# Patient Record
Sex: Male | Born: 1995 | Race: White | Hispanic: No | Marital: Married | Smoking: Never smoker
Health system: Southern US, Community
[De-identification: ages and names within clinical notes are randomized; demographics above are authoritative.]

## PROBLEM LIST (undated history)

## (undated) HISTORY — PX: OTHER SURGICAL HISTORY: SHX169

## (undated) HISTORY — PX: TONSILLECTOMY: SUR1361

---
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
## 2017-05-30 ENCOUNTER — Emergency Department (HOSPITAL_BASED_OUTPATIENT_CLINIC_OR_DEPARTMENT_OTHER)
Admission: EM | Admit: 2017-05-30 | Discharge: 2017-05-30 | Disposition: A | Discharge status: Home / Self Care | Payer: 59 | Attending: Emergency Medicine | Admitting: Emergency Medicine

## 2017-05-30 ENCOUNTER — Emergency Department (HOSPITAL_BASED_OUTPATIENT_CLINIC_OR_DEPARTMENT_OTHER): Payer: 59

## 2017-05-30 ENCOUNTER — Encounter (HOSPITAL_BASED_OUTPATIENT_CLINIC_OR_DEPARTMENT_OTHER): Payer: Self-pay | Admitting: *Deleted

## 2017-05-30 ENCOUNTER — Other Ambulatory Visit: Payer: Self-pay

## 2017-05-30 DIAGNOSIS — M545 Low back pain, unspecified: Secondary | ICD-10-CM

## 2017-05-30 MED ORDER — HYDROCODONE-ACETAMINOPHEN 5-325 MG PO TABS
1.0000 | ORAL_TABLET | Freq: Once | ORAL | Status: AC
  Administered 2017-05-30: 1 via ORAL
  Filled 2017-05-30: qty 1

## 2017-05-30 MED ORDER — CYCLOBENZAPRINE HCL 10 MG PO TABS: 10.0000 mg | ORAL_TABLET | Freq: Every evening | ORAL | 0 refills | Status: DC | PRN

## 2017-05-30 NOTE — Discharge Instructions (Signed)
Please read instructions below. Apply ice to your back for 20 minutes at a time. You can take advil/ibuprofen every 6 hours as needed for pain. OR you can take 500mg  of aleve every 12 hours. You can take flexeril at bedtime as needed for muscle spasm. Schedule an appointment with your primary care if symptoms persist Return to ER if new numbness or tingling in your arms or legs, inability to urinate, inability to hold your bowels, or weakness in your extremities.

## 2017-05-30 NOTE — ED Triage Notes (Signed)
Back injury today. He was at work and bent down. He felt a pop in his lower back.

## 2017-05-30 NOTE — ED Provider Notes (Signed)
[facility] EMERGENCY DEPARTMENT Provider Note   CSN: [id number] Arrival date & time: 05/30/17  1957     History   Chief Complaint Chief Complaint  Patient presents with  . Back Pain    HPI Cameron Carlson is a 22 y.o. male presenting to the ED with acute onset of left-sided lower back pain that began this morning while at work.  Patient states he squatted down with a straight back, and felt a pop and had pain.  He states he was not lifting anything or bending forward at the time.  He states he has been noticing pain is been worsening throughout the day, improved mildly with ibuprofen and Aleve.  Pain radiates somewhat into his left buttock.  Denies numbness or tingling in extremities, saddle paresthesia, bowel bladder incontinence, or other complaints.  The history is provided by the patient.    History reviewed. No pertinent past medical history.  There are no active problems to display for this patient.   Past Surgical History:  Procedure Laterality Date  . TONSILLECTOMY         Home Medications    Prior to Admission medications   Medication Sig Start Date End Date Taking? Authorizing Provider  cyclobenzaprine (FLEXERIL) 10 MG tablet Take 1 tablet (10 mg total) by mouth at bedtime as needed for muscle spasms. 05/30/17   Robinson, [place] N, PA-C    Family History No family history on file.  Social History Social History   Tobacco Use  . Smoking status: Never Smoker  . Smokeless tobacco: Never Used  Substance Use Topics  . Alcohol use: No  . Drug use: No     Allergies   Patient has no known allergies.   Review of Systems Review of Systems  Gastrointestinal:       No bowel incontinence  Genitourinary: Negative for difficulty urinating.  Musculoskeletal: Positive for back pain.  Neurological: Negative for weakness and numbness.  All other systems reviewed and are negative.    Physical Exam Updated Vital Signs BP (!) 142/81   Pulse 85    Temp 98.5 F (36.9 C) (Oral)   Resp 18   Ht 6\' 3"  (1.905 m)   Wt 106.6 kg (235 lb)   SpO2 100%   BMI 29.37 kg/m   Physical Exam  Constitutional: He appears well-developed and well-nourished. No distress.  HENT:  Head: Normocephalic and atraumatic.  Eyes: Conjunctivae are normal.  Cardiovascular: Normal rate and intact distal pulses.  Pulmonary/Chest: Effort normal.  Abdominal: Soft. Bowel sounds are normal. He exhibits no distension. There is no tenderness.  Musculoskeletal:       Back:  No midline spinal or paraspinal tenderness, no bony step-offs or gross deformities.  Tenderness to left-sided lower back musculature.  Neurological:  Motor:  Normal tone. 5/5 inlower extremities bilaterally including strong and equal dorsiflexion/plantar flexion Sensory: Pinprick and light touch normal in all extremities.  Deep Tendon Reflexes: 2+ and symmetric in the biceps and patella Gait: normal gait and balance CV: distal pulses palpable throughout    Psychiatric: He has a normal mood and affect. His behavior is normal.  Nursing note and vitals reviewed.    ED Treatments / Results  Labs (all labs ordered are listed, but only abnormal results are displayed) Labs Reviewed - No data to display  EKG  EKG Interpretation None       Radiology Dg Lumbar Spine Complete  Result Date: 05/30/2017 CLINICAL DATA:  Back injury bending over.  Low back  pain EXAM: LUMBAR SPINE - COMPLETE 4+ VIEW COMPARISON:  None. FINDINGS: There is no evidence of lumbar spine fracture. Alignment is normal. Intervertebral disc spaces are maintained. IMPRESSION: Negative. Electronically Signed   By: Charlett Nose M.D.   On: 05/30/2017 22:39    Procedures Procedures (including critical care time)  Medications Ordered in ED Medications  HYDROcodone-acetaminophen (NORCO/VICODIN) 5-325 MG per tablet 1 tablet (1 tablet Oral Given 05/30/17 2248)     Initial Impression / Assessment and Plan / ED Course  I  have reviewed the triage vital signs and the nursing notes.  Pertinent labs & imaging results that were available during my care of the patient were reviewed by me and considered in my medical decision making (see chart for details).     Patient with acute back pain.  No neurological deficits and normal neuro exam.  Patient can walk but states is painful.  No loss of bowel or bladder control.  No concern for cauda equina.  L-spine xray is normal.  RICE protocol and pain medicine indicated and discussed with patient.   Discussed results, findings, treatment and follow up. Patient advised of return precautions. Patient verbalized understanding and agreed with plan.  Final Clinical Impressions(s) / ED Diagnoses   Final diagnoses:  Acute left-sided low back pain without sciatica    ED Discharge Orders        Ordered    cyclobenzaprine (FLEXERIL) 10 MG tablet  At bedtime PRN     05/30/17 2306       Robinson, [place] N, PA-C 05/30/17 2306    Pricilla Loveless, MD 05/31/17 [phone]

## 2018-08-30 IMAGING — DX DG LUMBAR SPINE COMPLETE 4+V
5 series · 5 of 5 positions shown · non-contrast
Comparison: None.

CLINICAL DATA: Back injury bending over.  Low back pain

EXAM:
LUMBAR SPINE - COMPLETE 4+ VIEW

[l-spine ap]
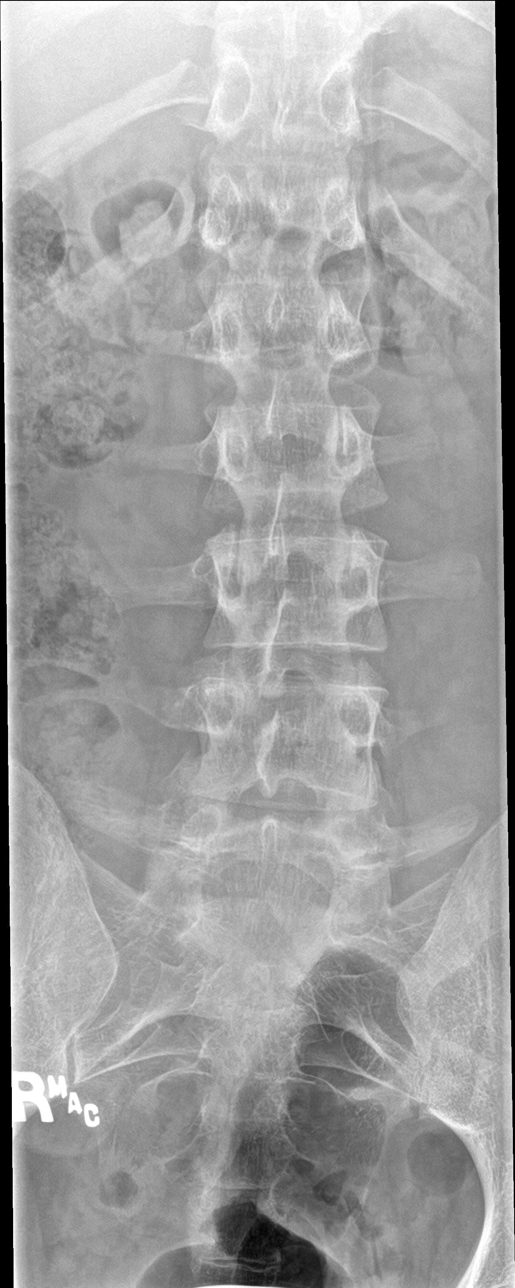

[l-spine obl (1 of 2)]
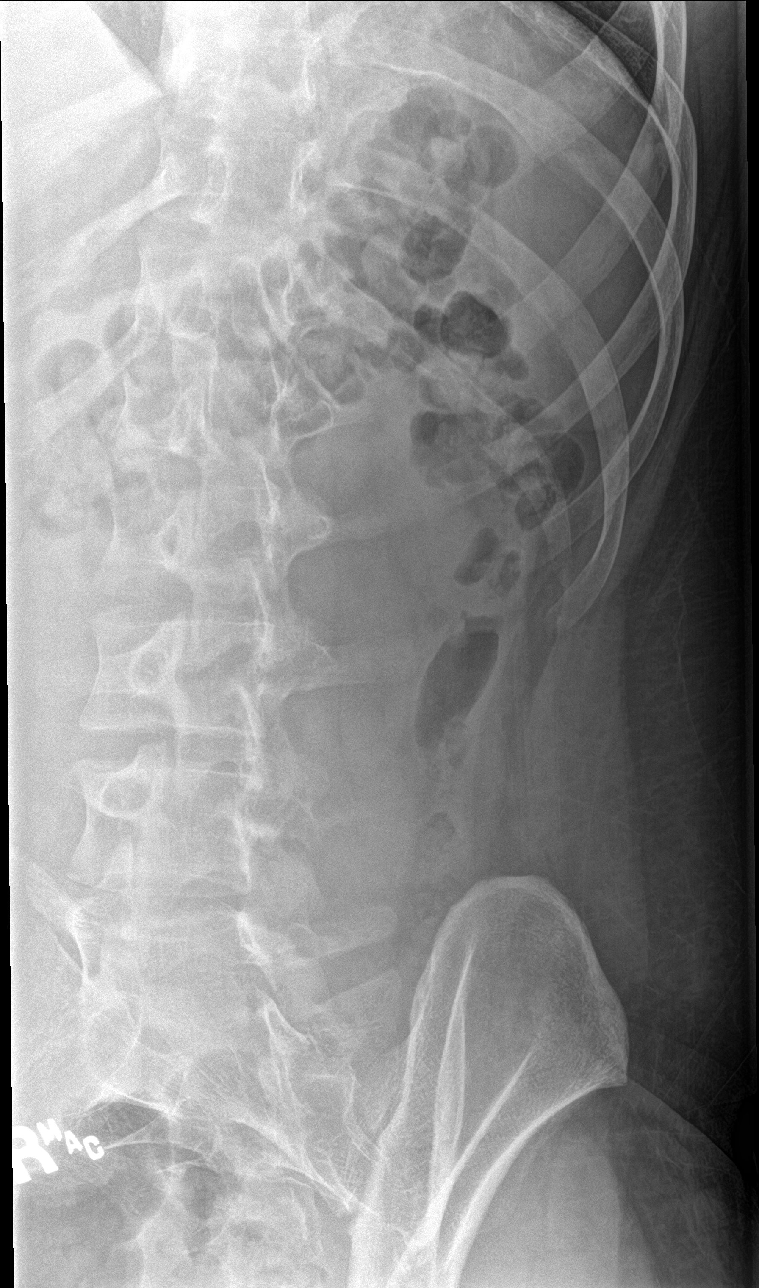

[l-spine obl (2 of 2)]
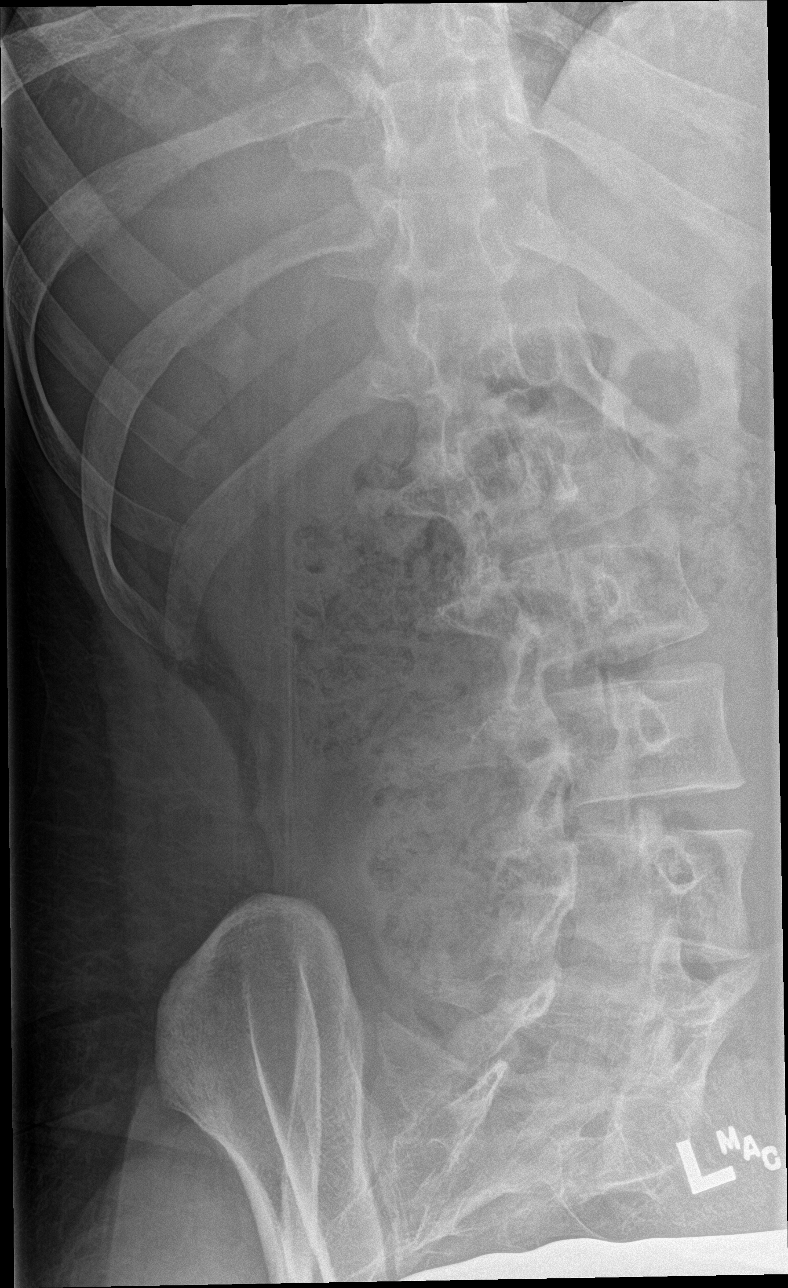

[l-spine lat]
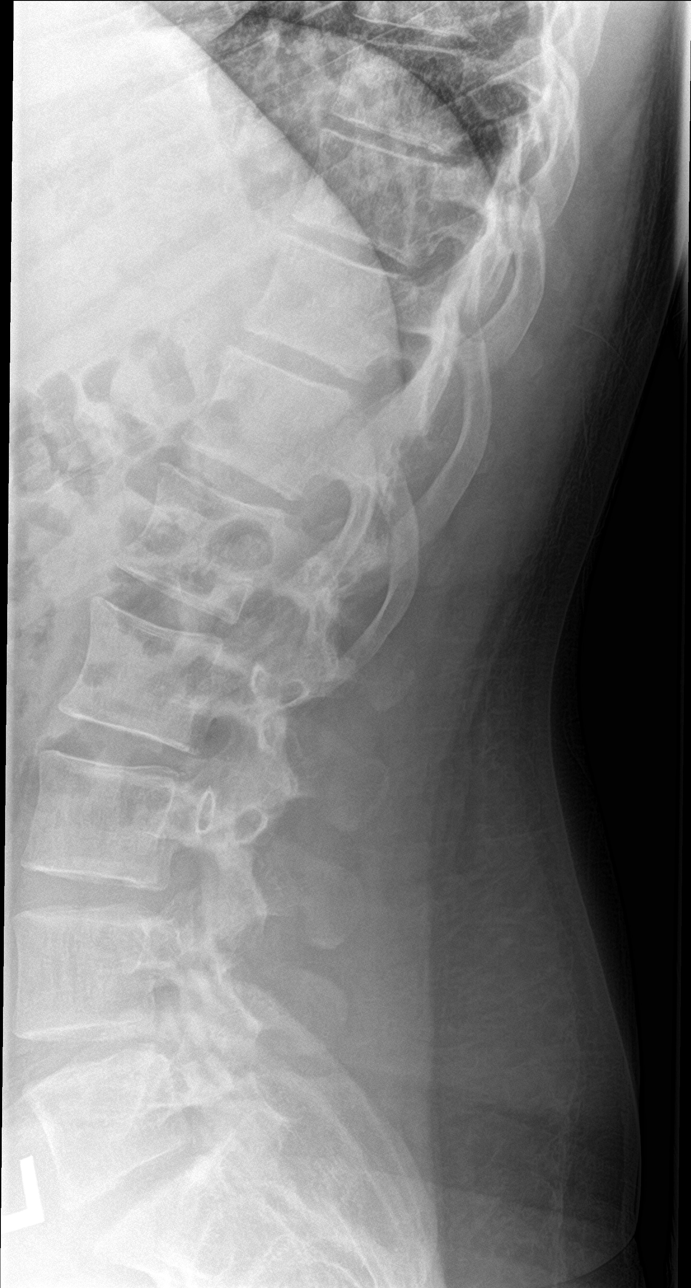

[l-spine spot]
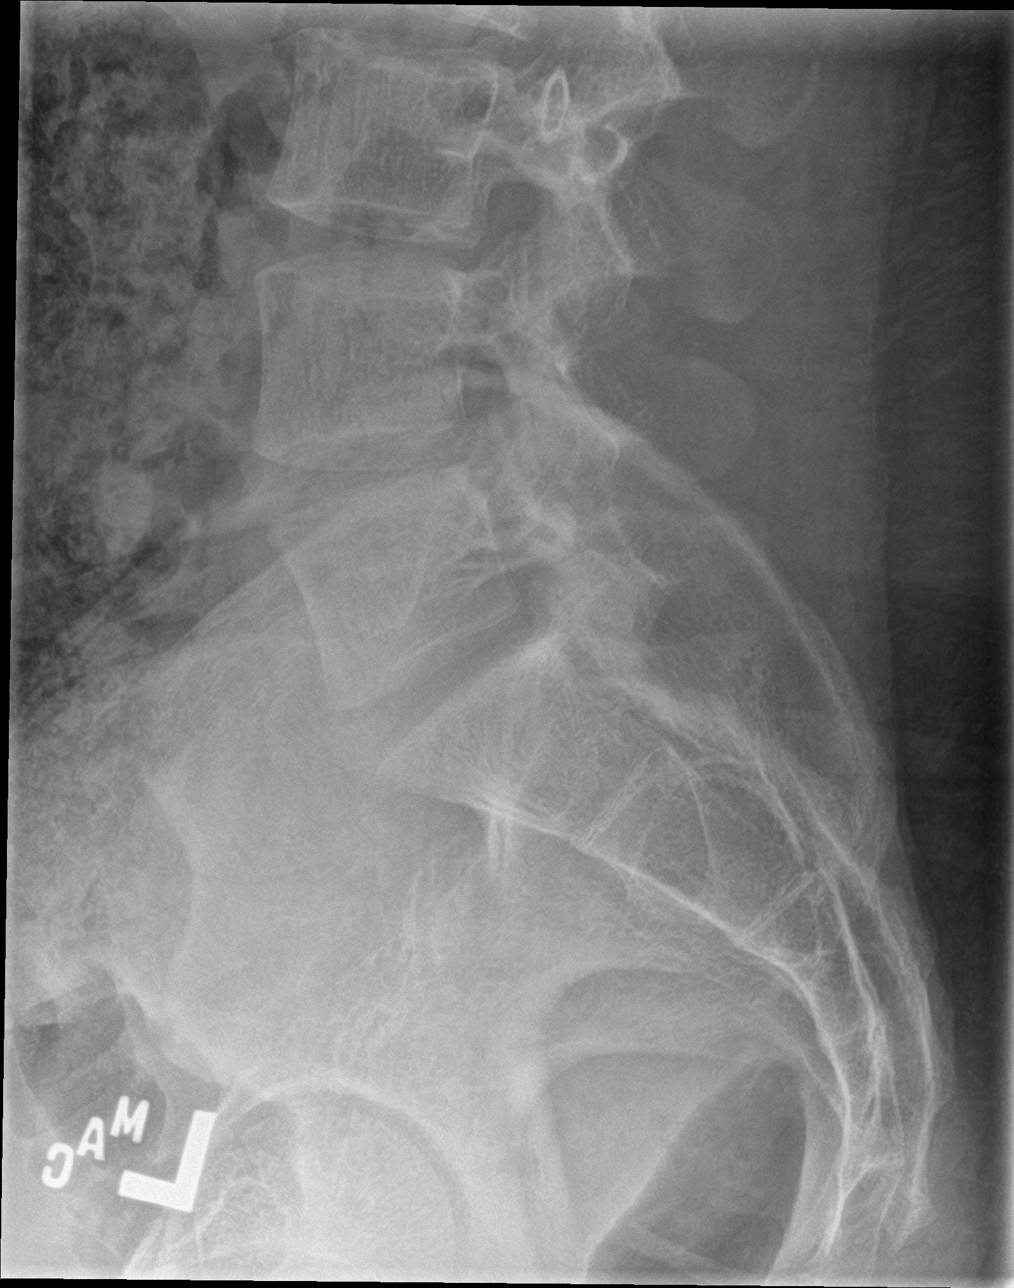

[5 of 5 positions shown; findings below may reference images not displayed]

FINDINGS: There is no evidence of lumbar spine fracture. Alignment is normal.
Intervertebral disc spaces are maintained.
IMPRESSION: Negative.

## 2019-02-12 ENCOUNTER — Encounter (HOSPITAL_BASED_OUTPATIENT_CLINIC_OR_DEPARTMENT_OTHER): Payer: Self-pay | Admitting: *Deleted

## 2019-02-12 ENCOUNTER — Other Ambulatory Visit: Payer: Self-pay

## 2019-02-12 ENCOUNTER — Emergency Department (HOSPITAL_BASED_OUTPATIENT_CLINIC_OR_DEPARTMENT_OTHER)
Admission: EM | Admit: 2019-02-12 | Discharge: 2019-02-12 | Disposition: A | Discharge status: Home / Self Care | Payer: 59 | Attending: Emergency Medicine | Admitting: Emergency Medicine

## 2019-02-12 DIAGNOSIS — Y9389 Activity, other specified: Secondary | ICD-10-CM | POA: Insufficient documentation

## 2019-02-12 DIAGNOSIS — X509XXA Other and unspecified overexertion or strenuous movements or postures, initial encounter: Secondary | ICD-10-CM | POA: Insufficient documentation

## 2019-02-12 DIAGNOSIS — S161XXA Strain of muscle, fascia and tendon at neck level, initial encounter: Secondary | ICD-10-CM | POA: Insufficient documentation

## 2019-02-12 DIAGNOSIS — Y999 Unspecified external cause status: Secondary | ICD-10-CM | POA: Diagnosis not present

## 2019-02-12 DIAGNOSIS — Y929 Unspecified place or not applicable: Secondary | ICD-10-CM | POA: Insufficient documentation

## 2019-02-12 DIAGNOSIS — S199XXA Unspecified injury of neck, initial encounter: Secondary | ICD-10-CM | POA: Diagnosis present

## 2019-02-12 MED ORDER — CYCLOBENZAPRINE HCL 10 MG PO TABS: 10.0000 mg | ORAL_TABLET | Freq: Two times a day (BID) | ORAL | 0 refills | Status: DC | PRN

## 2019-02-12 MED ORDER — IBUPROFEN 800 MG PO TABS: 800.0000 mg | ORAL_TABLET | Freq: Three times a day (TID) | ORAL | 0 refills | Status: DC

## 2019-02-12 NOTE — Discharge Instructions (Addendum)
Please follow-up with your orthopedist if the one that I have provided should your pain continue to persist or worsen after 2 days.  Please return to the ED or seek medical attention immediately should he develop any neurologic deficits, fever or chills, diminished strength, arm pain, or any other new or worsening symptoms.  Please take your medications, as prescribed. You were given a prescription for Flexeril which is a muscle relaxer.  You should not drive, work, consume alcohol, or operate machinery while taking this medication as it can make you very drowsy.    Please also consider conservative out of option such as ice and heating pads for symptomatic relief.

## 2019-02-12 NOTE — ED Provider Notes (Addendum)
[facility] [place] EMERGENCY DEPARTMENT Provider Note   CSN: [id number] Arrival date & time: 02/12/19  1256     History   Chief Complaint Chief Complaint  Patient presents with  . Arm Pain    HPI Cameron Carlson is a 23 y.o. male with no significant past medical history presents to the ED with a 8-hour history of right-sided trapezial discomfort.  He reports that he was "cracking his neck" shortly after waking up when he felt his right trapezial area "pop".  Since then, he reports 7 out of 10 right-sided neck stiffness and pain.  He denies any headache, dizziness, visual changes, limited range of motion, diminished strength, or sensation loss.  He is still O to move his right arm entirely.  He describes the pain as being located in the right trapezial region, immediately medial to his scapula.  Pain is aggravated by any rotation of the head.  He took 1 Advil, with some effect.  Patient works as a Development worker, international [employer].     HPI  History reviewed. No pertinent past medical history.  There are no active problems to display for this patient.   Past Surgical History:  Procedure Laterality Date  . TONSILLECTOMY          Home Medications    Prior to Admission medications   Medication Sig Start Date End Date Taking? Authorizing Provider  cyclobenzaprine (FLEXERIL) 10 MG tablet Take 1 tablet (10 mg total) by mouth 2 (two) times daily as needed for muscle spasms. 02/12/19   Corena Herter, PA-C  ibuprofen (ADVIL) 800 MG tablet Take 1 tablet (800 mg total) by mouth 3 (three) times daily. 02/12/19   Corena Herter, PA-C    Family History History reviewed. No pertinent family history.  Social History Social History   Tobacco Use  . Smoking status: Never Smoker  . Smokeless tobacco: Never Used  Substance Use Topics  . Alcohol use: No  . Drug use: No     Allergies   Patient has no known allergies.   Review of Systems Review of Systems  Constitutional: Negative for fever.   Respiratory: Negative for shortness of breath.   Musculoskeletal: Positive for neck pain and neck stiffness.  Neurological: Negative for dizziness, weakness, light-headedness, numbness and headaches.     Physical Exam Updated Vital Signs BP 136/85 (BP Location: Right Arm)   Pulse 85   Temp 98.4 F (36.9 C) (Oral)   Resp 16   Ht 6\' 2"  (1.88 m)   Wt 106.6 kg   SpO2 100%   BMI 30.17 kg/m   Physical Exam Vitals signs and nursing note reviewed. Exam conducted with a chaperone present.  Constitutional:      Appearance: Normal appearance.  HENT:     Head: Normocephalic and atraumatic.  Eyes:     General: No scleral icterus.    Conjunctiva/sclera: Conjunctivae normal.  Neck:     Musculoskeletal: Normal range of motion and neck supple. No neck rigidity or muscular tenderness.     Comments: Head rotated slightly away from affected side at rest.  Full range of motion, mild discomfort with rotation of head bilaterally.  No midline cervical spine tenderness palpation. Pulmonary:     Effort: Pulmonary effort is normal.  Musculoskeletal:     Comments: Right-sided trapezial tenderness to palpation.  Discomfort with rotation of head bilaterally.  No midline cervical tenderness palpation.  No overlying erythema, ecchymoses, or other skin changes.  No clavicular, scapular, or shoulder tenderness to palpation.  Right shoulder: Range of motion and strength fully intact.  No discomfort with movement of the right arm.  Distal pulses, sensation, and cap refill intact.  Grip strength intact bilaterally.  Skin:    General: Skin is dry.  Neurological:     Mental Status: He is alert.     GCS: GCS eye subscore is 4. GCS verbal subscore is 5. GCS motor subscore is 6.  Psychiatric:        Mood and Affect: Mood normal.        Behavior: Behavior normal.        Thought Content: Thought content normal.      ED Treatments / Results  Labs (all labs ordered are listed, but only abnormal results are  displayed) Labs Reviewed - No data to display  EKG None  Radiology No results found.  Procedures Procedures (including critical care time)  Medications Ordered in ED Medications - No data to display   Initial Impression / Assessment and Plan / ED Course  I have reviewed the triage vital signs and the nursing notes.  Pertinent labs & imaging results that were available during my care of the patient were reviewed by me and considered in my medical decision making (see chart for details).        Patient presents to the ED for history and physical exam consistent with an acute cervical strain and trapezial muscle spasms secondary to "neck cracking" by means of aggressive head rotation towards unaffected side.  Patient's range of motion is fully intact and he has no bony tenderness on exam.  I am not immediately concerned for any dislocation or fracture and do not feel as though plain films are indicated at this time.  I discussed this with the patient and he voiced his agreement.  There are no neurologic deficits and he is neurovascularly intact.  He denies any headache, dizziness, or any other symptoms and I am not concerned for carotid dissection or any other acute, emergent conditions.   His trapezial discomfort was reproduced on physical exam with palpation and with rotation of the head.  Will treat with Flexeril for his spasms as well as ibuprofen 800 mg 3 times daily as needed for his pain and inflammation.  We will put in a referral for him to see his old orthopedist at [facility] should his symptoms continue to persist after 2 days of treatment.  I have also encouraged him to return to the ED or seek medical attention immediately should he develop any neurologic deficits, fever or chills, diminished strength, arm pain, or any other new or worsening symptoms.  Final Clinical Impressions(s) / ED Diagnoses   Final diagnoses:  Acute strain of neck muscle, initial encounter    ED  Discharge Orders         Ordered    cyclobenzaprine (FLEXERIL) 10 MG tablet  2 times daily PRN     02/12/19 1448    ibuprofen (ADVIL) 800 MG tablet  3 times daily     02/12/19 1448           Lorelee New, PA-C 02/12/19 1458    Lorelee New, PA-C 02/12/19 1500    Terrilee Files, MD 02/12/19 1745

## 2019-02-12 NOTE — ED Triage Notes (Signed)
Pt states he woke up and "stretched wrong" this morning, heard a pop in his shoulder area and cont with pain and feeling sore.

## 2021-12-12 ENCOUNTER — Encounter (HOSPITAL_BASED_OUTPATIENT_CLINIC_OR_DEPARTMENT_OTHER): Payer: Self-pay | Admitting: Emergency Medicine

## 2021-12-12 ENCOUNTER — Emergency Department (HOSPITAL_BASED_OUTPATIENT_CLINIC_OR_DEPARTMENT_OTHER)
Admission: EM | Admit: 2021-12-12 | Discharge: 2021-12-12 | Disposition: A | Discharge status: Home / Self Care | Payer: No Typology Code available for payment source | Attending: Emergency Medicine | Admitting: Emergency Medicine

## 2021-12-12 ENCOUNTER — Other Ambulatory Visit: Payer: Self-pay

## 2021-12-12 ENCOUNTER — Emergency Department (HOSPITAL_BASED_OUTPATIENT_CLINIC_OR_DEPARTMENT_OTHER): Payer: No Typology Code available for payment source

## 2021-12-12 DIAGNOSIS — Y92510 Bank as the place of occurrence of the external cause: Secondary | ICD-10-CM | POA: Diagnosis not present

## 2021-12-12 DIAGNOSIS — W1789XA Other fall from one level to another, initial encounter: Secondary | ICD-10-CM | POA: Insufficient documentation

## 2021-12-12 DIAGNOSIS — S43005A Unspecified dislocation of left shoulder joint, initial encounter: Secondary | ICD-10-CM | POA: Diagnosis not present

## 2021-12-12 DIAGNOSIS — S4992XA Unspecified injury of left shoulder and upper arm, initial encounter: Secondary | ICD-10-CM | POA: Diagnosis present

## 2021-12-12 NOTE — Discharge Instructions (Addendum)
You may need to wear the immobilizer for the next week but after the first 3-4 days start removing the brace and ranging the shoulder some.  Take Tylenol and ibuprofen as needed for pain.  Avoid any heavy lifting until cleared by the orthopedist.

## 2021-12-12 NOTE — ED Provider Notes (Signed)
[facility] EMERGENCY DEPARTMENT Provider Note   CSN: [id number] Arrival date & time: 12/12/21  1616     History  Chief Complaint  Patient presents with   Shoulder Injury    Cameron Carlson is a 26 y.o. male.  Patient is a pleasant 26 year old male presenting today with an injury to his left shoulder.  Patient was walking [place] the [place] a creek when he slipped and fell approximately 4 feet catching himself on his left arm which then bent and he felt a pop in his arm with the severe pain.  He was able to stand up and he started lifting his arm when he said there was a loud thump and it felt like his arm popped back into place.  Since that time he has had pain in his left shoulder when he tries to move it and it is just feeling sore and throbbing.  He denies any numbness or tingling in his hand.  He has no elbow pain, clavicle pain or pain in his back.  He denies injury from the fall otherwise.  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  The history is provided by the patient.  Shoulder Injury       Home Medications Prior to Admission medications   Medication Sig Start Date End Date Taking? Authorizing Provider  cyclobenzaprine (FLEXERIL) 10 MG tablet Take 1 tablet (10 mg total) by mouth 2 (two) times daily as needed for muscle spasms. 02/12/19   Lorelee New, PA-C  ibuprofen (ADVIL) 800 MG tablet Take 1 tablet (800 mg total) by mouth 3 (three) times daily. 02/12/19   Lorelee New, PA-C      Allergies    Patient has no known allergies.    Review of Systems   Review of Systems  Physical Exam Updated Vital Signs BP (!) 157/94   Pulse 82   Temp 98.6 F (37 C) (Oral)   Resp 16   Ht 6\' 2"  (1.88 m)   Wt 131.5 kg   SpO2 98%   BMI 37.23 kg/m  Physical Exam Vitals and nursing note reviewed.  Constitutional:      Appearance: Normal appearance.  Cardiovascular:     Rate and Rhythm:  Normal rate.     Pulses: Normal pulses.  Pulmonary:     Effort: Pulmonary effort is normal.  Musculoskeletal:        General: Tenderness present.     Left shoulder: Tenderness present. No swelling or deformity. Decreased range of motion.     Comments: Tenderness in the [facility] joint of the left shoulder.  Pain with abduction.  Shoulder does not dislocate when range of motion is applied.  Neurological:     General: No focal deficit present.     Mental Status: He is alert.     Sensory: No sensory deficit.     Motor: No weakness.  Psychiatric:        Mood and Affect: Mood normal.     ED Results / Procedures / Treatments   Labs (all labs ordered are listed, but only abnormal results are displayed) Labs Reviewed - No data to display  EKG None  Radiology DG Shoulder Left  Result Date: 12/12/2021 CLINICAL DATA:  Fall, left shoulder pain.  Possible dislocation EXAM: LEFT SHOULDER - 2+ VIEW COMPARISON:  07/24/2014 FINDINGS: There is no evidence of fracture or dislocation. There is no evidence of arthropathy  or other focal bone abnormality. Soft tissues are unremarkable. IMPRESSION: Negative. Electronically Signed   By: Duanne Guess D.O.   On: 12/12/2021 16:54    Procedures Procedures    Medications Ordered in ED Medications - No data to display  ED Course/ Medical Decision Making/ A&P                           Medical Decision Making Amount and/or Complexity of Data Reviewed Radiology: ordered and independent interpretation performed. Decision-making details documented in ED Course.   Patient presenting today after a fall and left shoulder injury.  Based on patient's description of the injury it sounds like he dislocated his shoulder in the field but then relocated it prior to arrival here.  There is no other injury and patient is neurovascularly intact. I have independently visualized and interpreted pt's images today.  Shoulder image today is negative.  No evidence of fracture or  dislocation.  Findings discussed with the patient and his family.  He was placed in a shoulder immobilizer and given instructions for early range of motion.  Also instructed him to follow back up with orthopedics.         Final Clinical Impression(s) / ED Diagnoses Final diagnoses:  Shoulder dislocation, left, initial encounter    Rx / DC Orders ED Discharge Orders     None         Gwyneth Sprout, MD 12/12/21 [phone]

## 2021-12-12 NOTE — ED Triage Notes (Signed)
Patient states he fell earlier today and felt his left shoulder pop out of place, when he went to stand up, it popped back in, c/o pain since incident.

## 2023-02-01 ENCOUNTER — Ambulatory Visit: Payer: No Typology Code available for payment source | Admitting: Family Medicine

## 2023-06-04 ENCOUNTER — Encounter (HOSPITAL_BASED_OUTPATIENT_CLINIC_OR_DEPARTMENT_OTHER): Payer: Self-pay | Admitting: Emergency Medicine

## 2023-06-04 ENCOUNTER — Emergency Department (HOSPITAL_BASED_OUTPATIENT_CLINIC_OR_DEPARTMENT_OTHER): Payer: Worker's Compensation

## 2023-06-04 ENCOUNTER — Other Ambulatory Visit: Payer: Self-pay

## 2023-06-04 ENCOUNTER — Emergency Department (HOSPITAL_BASED_OUTPATIENT_CLINIC_OR_DEPARTMENT_OTHER)
Admission: EM | Admit: 2023-06-04 | Discharge: 2023-06-04 | Disposition: A | Discharge status: Home / Self Care | Payer: Worker's Compensation | Attending: Emergency Medicine | Admitting: Emergency Medicine

## 2023-06-04 DIAGNOSIS — S300XXA Contusion of lower back and pelvis, initial encounter: Secondary | ICD-10-CM | POA: Diagnosis not present

## 2023-06-04 DIAGNOSIS — W1830XA Fall on same level, unspecified, initial encounter: Secondary | ICD-10-CM | POA: Insufficient documentation

## 2023-06-04 DIAGNOSIS — M545 Low back pain, unspecified: Secondary | ICD-10-CM | POA: Diagnosis present

## 2023-06-04 MED ORDER — KETOROLAC TROMETHAMINE 30 MG/ML IJ SOLN
INTRAMUSCULAR | Status: AC
  Administered 2023-06-04: 30 mg via INTRAMUSCULAR
  Filled 2023-06-04: qty 1

## 2023-06-04 MED ORDER — CYCLOBENZAPRINE HCL 10 MG PO TABS: 10.0000 mg | ORAL_TABLET | Freq: Two times a day (BID) | ORAL | 0 refills | Status: AC | PRN

## 2023-06-04 MED ORDER — KETOROLAC TROMETHAMINE 60 MG/2ML IM SOLN: 30.0000 mg | Freq: Once | INTRAMUSCULAR | Status: AC

## 2023-06-04 NOTE — Discharge Instructions (Signed)
 Please read and follow all provided instructions.  Your diagnoses today include:  1. Contusion of lower back, initial encounter    Tests performed today include: Vital signs - see below for your results today X-ray of the lumbar spine was negative for fracture or other problems  Medications prescribed:  Flexeril (cyclobenzaprine) - muscle relaxer medication  DO NOT drive or perform any activities that require you to be awake and alert because this medicine can make you drowsy.   Take any prescribed medications only as directed.  Home care instructions:  Follow any educational materials contained in this packet Please rest, use ice or heat on your back for the next several days Do not lift, push, pull anything more than 10 pounds for the next week  Follow-up instructions: Please follow-up with occupational health as planned for further evaluation of your symptoms.   Return instructions:  SEEK IMMEDIATE MEDICAL ATTENTION IF YOU HAVE: New numbness, tingling, weakness, or problem with the use of your arms or legs Severe back pain not relieved with medications Loss control of your bowels or bladder Increasing pain in any areas of the body (such as chest or abdominal pain) Shortness of breath, dizziness, or fainting.  Worsening nausea (feeling sick to your stomach), vomiting, fever, or sweats Any other emergent concerns regarding your health   Additional Information:  Your vital signs today were: BP (!) 148/91 (BP Location: Right Arm)   Pulse 100   Temp (!) 97.2 F (36.2 C)   Resp 18   Ht 6\' 2"  (1.88 m)   Wt 131.5 kg   SpO2 100%   BMI 37.23 kg/m  If your blood pressure (BP) was elevated above 135/85 this visit, please have this repeated by your doctor within one month. --------------

## 2023-06-04 NOTE — ED Triage Notes (Signed)
 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

## 2023-06-04 NOTE — ED Provider Notes (Signed)
 [place] EMERGENCY DEPARTMENT AT [facility] Provider Note   CSN: [id number] Arrival date & time: 06/04/23  1546     History  Chief Complaint  Patient presents with   Back Pain    Cameron Carlson is a 28 y.o. male.  Patient presents to the emergency department 2 days after a fall.  Patient is in police training.  He fell onto his lower back.  He was wearing a belt when this occurred.  Patient has had pain in his lower back.  Sometimes it will shoot down his left leg.  Pain favors the left side in the lower back.  Patient followed up with occupational health yesterday.  He was placed on NSAIDs.  He tried these last night and it did not help much, in fact he states that his pain was worse.  Pain is worse with movement and coughing.  Patient denies warning symptoms of back pain including: fecal incontinence, urinary retention or overflow incontinence, night sweats, waking from sleep with back pain, unexplained fevers or weight loss, h/o cancer, IVDU.          Home Medications Prior to Admission medications   Medication Sig Start Date End Date Taking? Authorizing Provider  cyclobenzaprine (FLEXERIL) 10 MG tablet Take 1 tablet (10 mg total) by mouth 2 (two) times daily as needed for muscle spasms. 06/04/23  Yes Renne Crigler, PA-C      Allergies    Patient has no known allergies.    Review of Systems   Review of Systems  Physical Exam Updated Vital Signs BP (!) 148/91 (BP Location: Right Arm)   Pulse 100   Temp (!) 97.2 F (36.2 C)   Resp 18   Ht 6\' 2"  (1.88 m)   Wt 131.5 kg   SpO2 100%   BMI 37.23 kg/m  Physical Exam Vitals and nursing note reviewed.  Constitutional:      Appearance: He is well-developed.  HENT:     Head: Normocephalic and atraumatic.  Eyes:     Conjunctiva/sclera: Conjunctivae normal.  Abdominal:     Palpations: Abdomen is soft.     Tenderness: There is no abdominal tenderness. There is no right CVA tenderness or left CVA tenderness.   Musculoskeletal:     Cervical back: Normal range of motion. No bony tenderness.     Thoracic back: No bony tenderness.     Lumbar back: Spasms and tenderness present. No bony tenderness. Decreased range of motion.       Back:     Comments: No step-off noted with palpation of spine.   Skin:    General: Skin is warm and dry.  Neurological:     Mental Status: He is alert.     Sensory: No sensory deficit.     Motor: No abnormal muscle tone.     Comments: 5/5 strength in entire lower extremities bilaterally. No sensation deficit.   Psychiatric:        Mood and Affect: Mood normal.     ED Results / Procedures / Treatments   Labs (all labs ordered are listed, but only abnormal results are displayed) Labs Reviewed - No data to display  EKG None  Radiology DG Lumbar Spine Complete Result Date: 06/04/2023 CLINICAL DATA:  Fall pain on the left side EXAM: LUMBAR SPINE - COMPLETE 4+ VIEW COMPARISON:  05/30/2017 FINDINGS: There is no evidence of lumbar spine fracture. Alignment is normal. Intervertebral disc spaces are maintained. IMPRESSION: Negative. Electronically Signed   By: Selena Batten  Jake Samples M.D.   On: 06/04/2023 17:27    Procedures Procedures    Medications Ordered in ED Medications  ketorolac (TORADOL) injection 30 mg (has no administration in time range)    ED Course/ Medical Decision Making/ A&P    Patient seen and examined. History obtained directly from patient.  Reviewed occupational health notes.  Labs/EKG: None ordered.  Imaging: X-ray of the lumbar spine personally reviewed and interpreted, agree negative.  Medications/Fluids: Ordered: IM Toradol  Most recent vital signs reviewed and are as follows: BP (!) 148/91 (BP Location: Right Arm)   Pulse 100   Temp (!) 97.2 F (36.2 C)   Resp 18   Ht 6\' 2"  (1.88 m)   Wt 131.5 kg   SpO2 100%   BMI 37.23 kg/m   Initial impression: Low back pain/contusion  Home treatment plan: Patient was counseled on back pain  precautions and advised to do activity as tolerated but avoid strenuous activity and do not lift, push, or pull heavy objects more than 10 pounds for the next week.  Patient counseled to use ice or heat on back as needed for pain and spasm.    Medications prescribed: Cyclobenzaprine for muscle pain and spasm. Patient counseled on proper use of muscle relaxant medication.  They were requested not to drink alcohol, drive any vehicle, or do any dangerous activities while taking this medication due to potential drowsiness and unintended.  Patient verbalized understanding.  Return instructions discussed with patient: Urged to return with worsening severe pain, loss of bowel or bladder control, trouble walking, development of weakness in the legs, or with any other concerns.  Follow-up instructions discussed with patient: Patient urged to follow-up with occupational health next week as planned.                                Medical Decision Making Amount and/or Complexity of Data Reviewed Radiology: ordered.  Risk Prescription drug management.   Patient with back pain after fall 2 days ago. No neurological deficits. Patient is ambulatory. No warning symptoms of back pain including: fecal incontinence, urinary retention or overflow incontinence, night sweats, waking from sleep with back pain, unexplained fevers or weight loss, h/o cancer, IVDU, recent trauma. No concern for cauda equina, epidural abscess, or other serious cause of back pain.  Plain films unremarkable.  Conservative measures such as rest, ice/heat and pain medicine indicated with occupational health follow-up as planned.        Final Clinical Impression(s) / ED Diagnoses Final diagnoses:  Contusion of lower back, initial encounter    Rx / DC Orders ED Discharge Orders          Ordered    cyclobenzaprine (FLEXERIL) 10 MG tablet  2 times daily PRN        06/04/23 1749              Renne Crigler, PA-C 06/04/23  1754    Sloan Leiter, DO 06/05/23 2235

## 2023-12-15 ENCOUNTER — Other Ambulatory Visit: Payer: Self-pay

## 2023-12-15 ENCOUNTER — Emergency Department (HOSPITAL_BASED_OUTPATIENT_CLINIC_OR_DEPARTMENT_OTHER)
Admission: EM | Admit: 2023-12-15 | Discharge: 2023-12-15 | Disposition: A | Discharge status: Home / Self Care | Attending: Emergency Medicine | Admitting: Emergency Medicine

## 2023-12-15 ENCOUNTER — Encounter (HOSPITAL_BASED_OUTPATIENT_CLINIC_OR_DEPARTMENT_OTHER): Payer: Self-pay | Admitting: Emergency Medicine

## 2023-12-15 DIAGNOSIS — G4489 Other headache syndrome: Secondary | ICD-10-CM | POA: Insufficient documentation

## 2023-12-15 DIAGNOSIS — R519 Headache, unspecified: Secondary | ICD-10-CM | POA: Diagnosis present

## 2023-12-15 DIAGNOSIS — H5704 Mydriasis: Secondary | ICD-10-CM | POA: Insufficient documentation

## 2023-12-15 MED ORDER — IBUPROFEN 400 MG PO TABS
400.0000 mg | ORAL_TABLET | Freq: Once | ORAL | Status: AC
  Administered 2023-12-15: 400 mg via ORAL
  Filled 2023-12-15: qty 1

## 2023-12-15 NOTE — ED Triage Notes (Signed)
 Pt reports his contact came out, he used allergy drops to wet the contact to put the contact back in, soon after pt reports blurry vision to the right eye, pupil dilated; pt further endorses migraine

## 2023-12-15 NOTE — ED Provider Notes (Signed)
 [place] EMERGENCY DEPARTMENT AT [facility] Provider Note   CSN: [id number] Arrival date & time: 12/15/23  2219     Patient presents with: Eye Problem and Headache   Cameron Carlson is a 28 y.o. male.   The history is provided by the patient and a parent.  Headache Associated symptoms: no eye pain, no fever, no numbness and no weakness       Patient with history of migraines presents with headache and right eye dilation and blurred vision  Patient is a Chartered certified accountant.  In the midst of an emergency call, his contact lens came out of his right eye.  To help with the contact to put it back in his right eye, he used pheniramine/naphazoline eye drops (allergy drops he has used in the past) Later on he noted blurred vision in the right eye and when looking in the mirror his right pupil was dilated.  Denies any trauma or surgery to the eye previously.  He is also having a mild migraine headache similar to prior.  No fevers or vomiting.  No arm or leg weakness or numbness.  No slurred speech.  He has no other acute complaints.  No previous history of CVA  Prior to Admission medications   Medication Sig Start Date End Date Taking? Authorizing Provider  cyclobenzaprine  (FLEXERIL ) 10 MG tablet Take 1 tablet (10 mg total) by mouth 2 (two) times daily as needed for muscle spasms. 06/04/23   Geiple, Joshua, PA-C    Allergies: Patient has no known allergies.    Review of Systems  Constitutional:  Negative for fever.  Eyes:  Positive for visual disturbance. Negative for pain.  Neurological:  Positive for headaches. Negative for speech difficulty, weakness and numbness.    Updated Vital Signs BP 131/84 (BP Location: Right Arm)   Pulse 100   Temp 98.2 F (36.8 C) (Oral)   Resp 18   Ht 1.88 m (6' 2)   Wt 131.5 kg   SpO2 98%   BMI 37.23 kg/m   Physical Exam CONSTITUTIONAL: Well developed/well nourished HEAD: Normocephalic/atraumatic EYES: Right pupil significantly  dilated compared to the left, both pupils are reactive to light, no nystagmus, no ptosis, normal fundoscopic exam (no papilledema)  ENMT: Mucous membranes moist NECK: supple no meningeal signs, no bruits SPINE/BACK:entire spine nontender CV: S1/S2 noted, no murmurs/rubs/gallops noted LUNGS: Lungs are clear to auscultation bilaterally, no apparent distress ABDOMEN: soft, nontender, no rebound or guarding GU:no cva tenderness NEURO:Awake/alert, face symmetric, no arm or leg drift is noted Equal 5/5 strength with shoulder abduction, elbow flex/extension, wrist flex/extension in upper extremities and equal hand grips bilaterally Equal 5/5 strength with hip flexion,knee flex/extension, foot dorsi/plantar flexion Cranial nerves 3/4/5/6/10/11/08/11/12 tested and intact No past pointing Sensation to light touch intact in all extremities EXTREMITIES: pulses normal, full ROM SKIN: warm, color normal PSYCH: no abnormalities of mood noted, alert and oriented to situation  (all labs ordered are listed, but only abnormal results are displayed) Labs Reviewed - No data to display  EKG: None  Radiology: No results found.   Procedures   Medications Ordered in the ED  ibuprofen  (ADVIL ) tablet 400 mg (has no administration in time range)                                    Medical Decision Making Risk Prescription drug management.   This patient presents to the ED  for concern of headache, this involves an extensive number of treatment options, and is a complaint that carries with it a high risk of complications and morbidity.  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 managing their presentation  Additional history obtained: Additional history obtained from family  Test Considered: I considered neuroimaging, but since 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    Patient reports he was at his baseline when his contact came out and he used allergy eyedrops to help replace the lens.  Soon after he noted blurred vision and dilated pupil.  Strong suspicion this is related to the ophthalmic allergy medicines he is use as it is a known side effect then triggered a typical migraine for him.  He has no neurodeficits. He is well-appearing, smiling and in no acute distress. Advised his meds will likely wear off over the next day but he has also been advised to call ophthalmology if there is no improvement    Final diagnoses:  Other headache syndrome  Mydriasis    ED Discharge Orders     None          Midge Golas, MD 12/15/23 2344

## 2023-12-15 NOTE — Discharge Instructions (Signed)
